# Patient Record
Sex: Male | Born: 1965
Health system: Southern US, Community
[De-identification: ages and names within clinical notes are randomized; demographics above are authoritative.]

## PROBLEM LIST (undated history)

## (undated) DIAGNOSIS — E119 Type 2 diabetes mellitus without complications: Secondary | ICD-10-CM

## (undated) HISTORY — PX: INGUINAL HERNIA REPAIR: SUR1180

## (undated) HISTORY — DX: Type 2 diabetes mellitus without complications: E11.9

---

## 2010-01-27 ENCOUNTER — Emergency Department (HOSPITAL_BASED_OUTPATIENT_CLINIC_OR_DEPARTMENT_OTHER)
Admission: EM | Admit: 2010-01-27 | Discharge: 2010-01-27 | Payer: Self-pay | Source: Home / Self Care | Admitting: Emergency Medicine

## 2010-02-06 ENCOUNTER — Ambulatory Visit: Payer: Self-pay | Admitting: Family Medicine

## 2010-02-06 ENCOUNTER — Ambulatory Visit (HOSPITAL_BASED_OUTPATIENT_CLINIC_OR_DEPARTMENT_OTHER)
Admission: RE | Admit: 2010-02-06 | Discharge: 2010-02-06 | Payer: Self-pay | Source: Home / Self Care | Attending: Family Medicine | Admitting: Family Medicine

## 2010-02-06 DIAGNOSIS — M25539 Pain in unspecified wrist: Secondary | ICD-10-CM

## 2010-02-06 DIAGNOSIS — M25519 Pain in unspecified shoulder: Secondary | ICD-10-CM

## 2010-03-22 NOTE — Assessment & Plan Note (Signed)
Summary: NP,SPRAINED RT WRIST/SHOULDER PAIN,DUE TO FALL 01/27/10,[facility]   Vital Signs:  Patient profile:   45 year old male Height:      70 inches (177.80 cm) Weight:      139.4 pounds (63.36 kg) BMI:     20.07 Temp:     97.8 degrees F (36.56 degrees C) oral Pulse rate:   73 / minute BP sitting:   125 / 80  (left arm)  Vitals Entered By: Baxter Hire) (February 06, 2010 9:32 AM) CC: Sprained right wrist and right shoulder pain Pain Assessment Patient in pain? yes     Location: rt. wrist/shoulder Type: throbbing Onset of pain  pain for the past 10 days/patient fell while roller skating Nutritional Status BMI of 19 -24 = normal  Does patient need assistance? Functional Status Self care Ambulation Normal   CC:  Sprained right wrist and right shoulder pain.  History of Present Illness: 45 yo M here for right wrist and shoulder pain  Patient reports 10 days ago (on 12/10) he was roller skating with his child. He fell down onto an outstretched right hand (wrist extended).  Developed pain mostly in wrist volar area initially and has since developed worse right shoulder pain. Does a lot of work with his hands and using computers. Pain in wrist worse by end of day. No numbness or tingling in right upper extremity. No prior wrist or shoulder injuries. Is right handed. Went to ED after initial injury and had wrist x-rays that were negative.  Advised to come here if not improving.   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 No focal TTP about shoulder. FROM but painful arc. Strength 5/5 with empty can, resisted IR/ER.  Mild pain with resisted IR. + Hawkins, negative Neers Negative speeds and yergasons Negative apprehension NVI distally  R wrist: Small amount of bruising and minimal swelling volar aspect of wrist about distal radius.  TTP about this area.  No focal TTP through hand, in snuffbox, or over ulna.  ~10 degree limitation in extension otherwise FROM. Grip strength 5/5.  Pain with resisted flexion. NVI distally   Impression & Recommendations:  Problem # 1:  WRIST PAIN, RIGHT (WUX-324.40) Assessment New Repeat radiographs negative for fracture.  Given mechanism, location of pain, and exam, feel he has a contusion with likely tendinitis.  Brace, icing, NSAIDs, elevation when possible, relative rest.  Should improve over the next 2-3 weeks. Orders: Diagnostic X-Ray/Fluoroscopy (Diagnostic X-Ray/Flu) Brace Wrist ([id number])  Problem # 2:  SHOULDER PAIN, RIGHT (ICD-719.41) Assessment: New Exam consistent with rotator cuff strain +/- sprain of shoulder capsule.  Strength intact so unlikely to have a significant rotator cuff injury.  Treat conservatively - NSAIDs, icing, limit painful activities.  F/u in 2-3 weeks if not continuing to improve.  Orders: Diagnostic X-Ray/Fluoroscopy (Diagnostic X-Ray/Flu) Brace Wrist ([id number])  Patient Instructions: 1)  Your x-rays of your shoulder and repeat x-rays of your wrist were negative. 2)  Based on your exam you have strained right shoulder and a contusion/tendonitis  of your right wrist. 3)  Both of these will improve with time. 4)  Ice the areas 15 minutes at a time 3-4 times a day. 5)  Try to avoid painful activities - especially lifting with an extended arm and overhead activities. 6)  These will likely improve over the next 2-3 weeks. 7)  Take aleve 2 tabs twice a day with food  regularly for 1 week then as needed after that. 8)  If pain is still not improving after 2-3 weeks, come back in for a reevaluation - we may have to proceed with an MRI of your shoulder if so to ensure there wasn't damage to the muscles or cartilage around the shoulder.   Orders Added: 1)  Diagnostic X-Ray/Fluoroscopy [Diagnostic X-Ray/Flu] 2)  Diagnostic X-Ray/Fluoroscopy [Diagnostic X-Ray/Flu] 3)  New Patient Level III [69629] 4)  Brace Wrist [[id number]]

## 2010-07-24 ENCOUNTER — Encounter: Payer: Self-pay | Admitting: Family Medicine

## 2010-07-24 ENCOUNTER — Ambulatory Visit: Payer: BC Managed Care – PPO | Attending: Family Medicine | Admitting: Physical Therapy

## 2010-07-24 ENCOUNTER — Ambulatory Visit (INDEPENDENT_AMBULATORY_CARE_PROVIDER_SITE_OTHER): Payer: BC Managed Care – PPO | Admitting: Family Medicine

## 2010-07-24 VITALS — BP 125/89 | HR 73 | Temp 97.6°F | Ht 70.0 in | Wt 135.0 lb

## 2010-07-24 DIAGNOSIS — M546 Pain in thoracic spine: Secondary | ICD-10-CM | POA: Insufficient documentation

## 2010-07-24 DIAGNOSIS — M25519 Pain in unspecified shoulder: Secondary | ICD-10-CM

## 2010-07-24 DIAGNOSIS — IMO0001 Reserved for inherently not codable concepts without codable children: Secondary | ICD-10-CM | POA: Insufficient documentation

## 2010-07-24 NOTE — Assessment & Plan Note (Signed)
Patient's pain and exam consistent with scapular weakness/dysfunction and rotator cuff strain.  Excellent strength, negative drop arm test - full thickness tear very unlikely though may have very small partial tear.  Should do well with conservative care - nsaids + PT to start.  If not improving after 6 weeks will consider further imaging +/- subacromial injection depending on his exam.  F/u in 6 weeks.

## 2010-07-24 NOTE — Progress Notes (Signed)
  Subjective:    Patient ID: Matthew Mcintyre, male    DOB: September 02, 1965, 45 y.o.   MRN: [id number]  HPI  45 yo M here for right shoulder pain.  Patient's initial injury was on 01/27/10 when roller skating with his child. Patient reports 10 days ago (on 12/10) he was roller skating with his child. He fell down onto an outstretched right hand (wrist extended).  Developed pain mostly in wrist volar area initially and since developed worse right shoulder pain. Does a lot of work with his hands and using computers. No numbness or tingling in right upper extremity. No prior or shoulder injuries. Is right handed. Shoulder radiographs were negative. Discussed at visit if not improving after 2-3 weeks to return but has just been dealing with pain since then. Pain is top shoulder and in upper back. Worse with overhead motions Stiff in the morning, better during the day. Taking ibuprofen and using icy hot Does not wake him up at night.  Worse lying on right side though.  History reviewed. No pertinent past medical history.  No current outpatient prescriptions on file prior to visit.    History reviewed. No pertinent past surgical history.  No Known Allergies  History   Social History  . Marital Status: Divorced    Spouse Name: N/A    Number of Children: N/A  . Years of Education: N/A   Occupational History  . Not on file.   Social History Main Topics  . Smoking status: Never Smoker   . Smokeless tobacco: Not on file  . Alcohol Use: Not on file  . Drug Use: Not on file  . Sexually Active: Not on file   Other Topics Concern  . Not on file   Social History Narrative  . No narrative on file    Family History  Problem Relation Age of Onset  . Hypertension Mother   . Heart attack Neg Hx   . Diabetes Neg Hx     BP 125/89  Pulse 73  Temp(Src) 97.6 F (36.4 C) (Oral)  Ht 5\' 10"  (1.778 m)  Wt 135 lb (61.236 kg)  BMI 19.37 kg/m2  Review of Systems See HPI above.      Objective:   Physical Exam  General:  Well-developed,well-nourished,in no acute distress; alert,appropriate and cooperative throughout examination  R shoulder: No gross deformity, swelling, or bruising. No focal TTP about shoulder including biceps tendon or AC joint.  Mild TTP superficial to right scapula and medial to scapula in back. FROM but painful arc - pain upper back and lateral right shoulder. Strength 5/5 with empty can, resisted IR/ER.  Mild pain with resisted ER and empty can.  Negative drop arm. + Hawkins, negative Neers Negative speeds and yergasons Negative apprehension NVI distally No scapular winging.     Assessment & Plan:  1. Right shoulder pain - Patient's pain and exam consistent with scapular weakness/dysfunction and rotator cuff strain.  Excellent strength, negative drop arm test - full thickness tear very unlikely though may have very small partial tear.  Should do well with conservative care - nsaids + PT to start.  If not improving after 6 weeks will consider further imaging +/- subacromial injection depending on his exam.  F/u in 6 weeks.

## 2010-07-24 NOTE — Patient Instructions (Signed)
Your exam is consistent with scapular dysfunction (spasm and pain in right upper back muscles that control shoulder blade movement) and rotator cuff tendinopathy. Your strength is great and exam does not indicate a rotator cuff tear that would need surgery. Start physical therapy regularly for the next 6 weeks. Take aleve 2 tabs twice a day with food for pain x 7 days then as needed. Ice shoulder 15 minutes at a time 3-4 times a day. Try to avoid overhead activities, reaching activities as much as possible. Follow up with me in 6 weeks to recheck how you're doing (4 weeks if really struggling). The next step would be an MRI of your shoulder if you're not improving.

## 2010-08-07 ENCOUNTER — Ambulatory Visit: Payer: BC Managed Care – PPO | Admitting: Physical Therapy

## 2010-08-09 ENCOUNTER — Ambulatory Visit: Payer: BC Managed Care – PPO | Admitting: Physical Therapy

## 2010-08-15 ENCOUNTER — Ambulatory Visit: Payer: BC Managed Care – PPO | Admitting: Rehabilitation

## 2010-08-21 ENCOUNTER — Encounter: Payer: BC Managed Care – PPO | Admitting: Rehabilitation

## 2010-08-28 ENCOUNTER — Ambulatory Visit: Payer: BC Managed Care – PPO | Admitting: Rehabilitation

## 2010-09-04 ENCOUNTER — Ambulatory Visit: Payer: BC Managed Care – PPO | Admitting: Family Medicine

## 2011-09-06 IMAGING — CR DG SHOULDER 2+V*R*
4 series · 4 of 4 positions shown · non-contrast
Comparison: None.

CLINICAL DATA: 44-year-old male status post fall 10 days ago with
pain.

RIGHT SHOULDER - 2+ VIEW

[w shoulder ap internal righ]
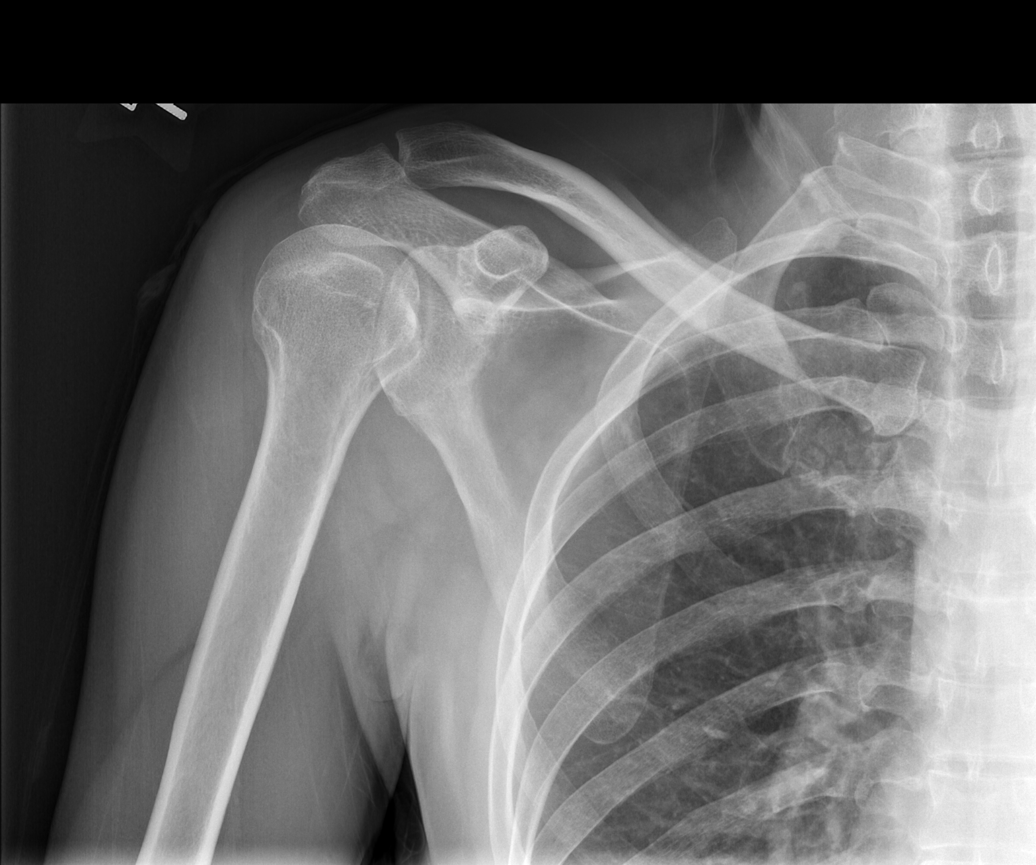

[w shoulder ap external righ]
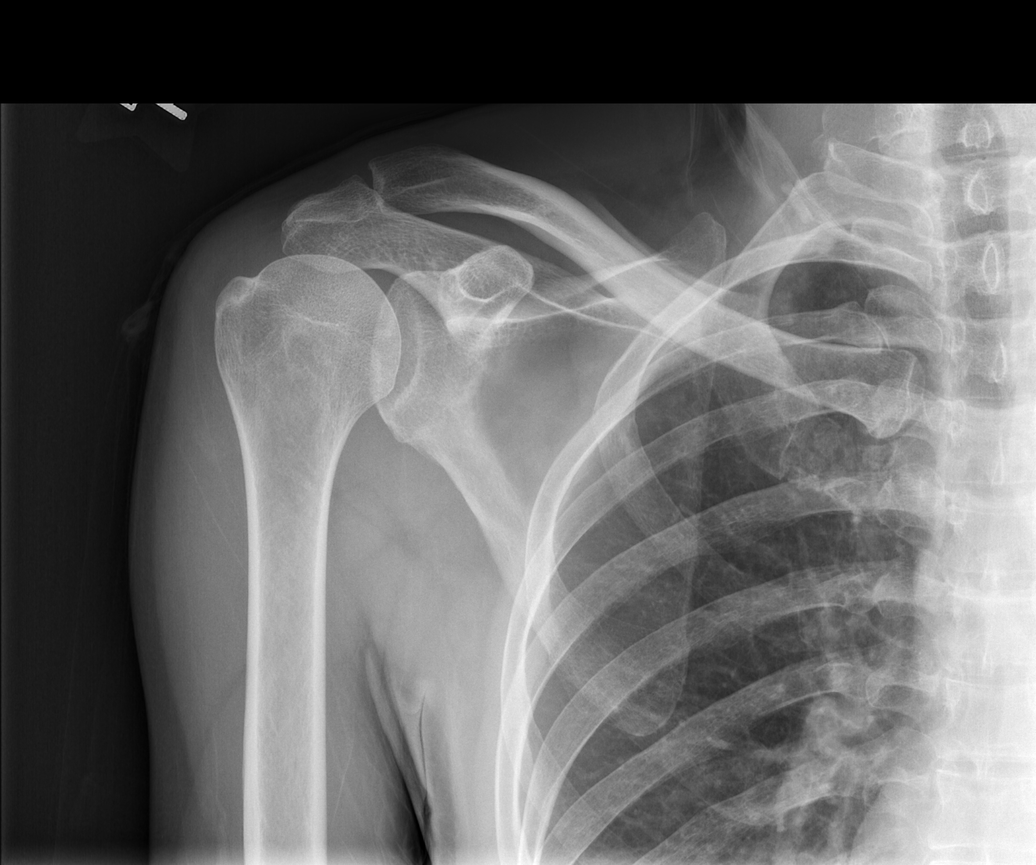

[w shoulder y view right]
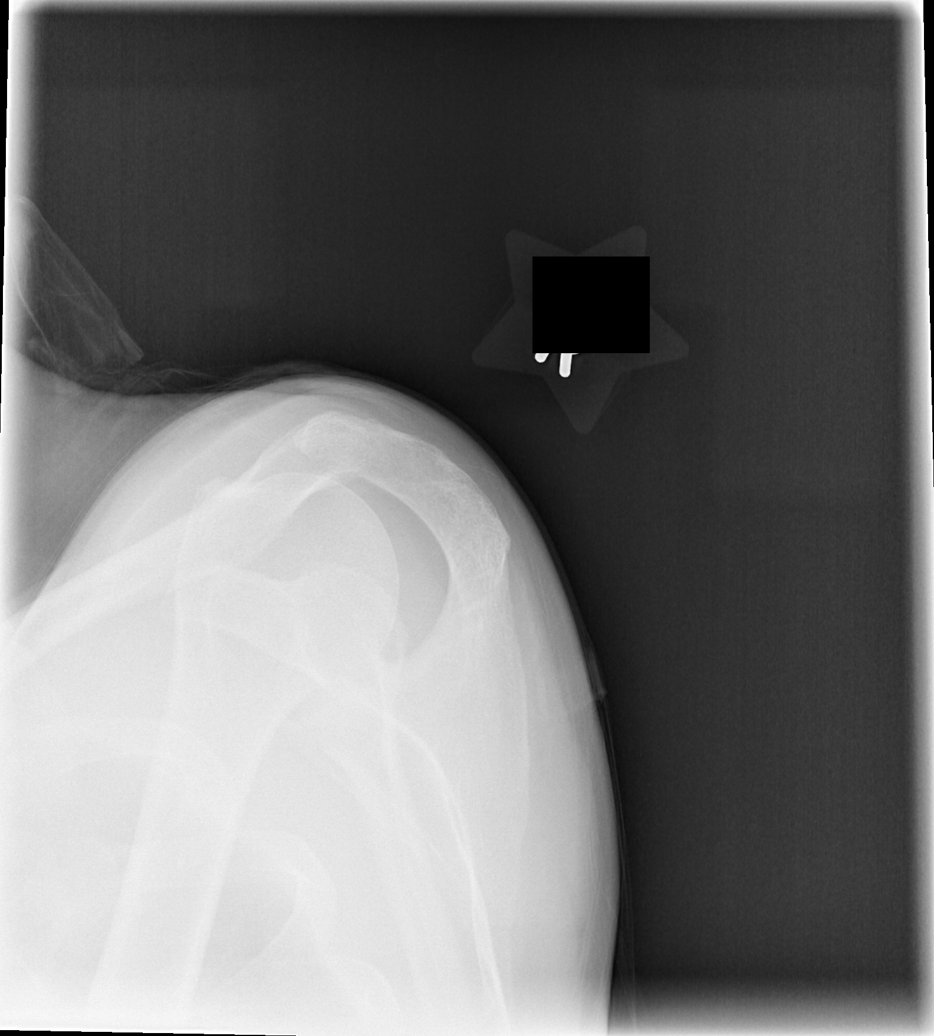

[x shoulder axillary right]
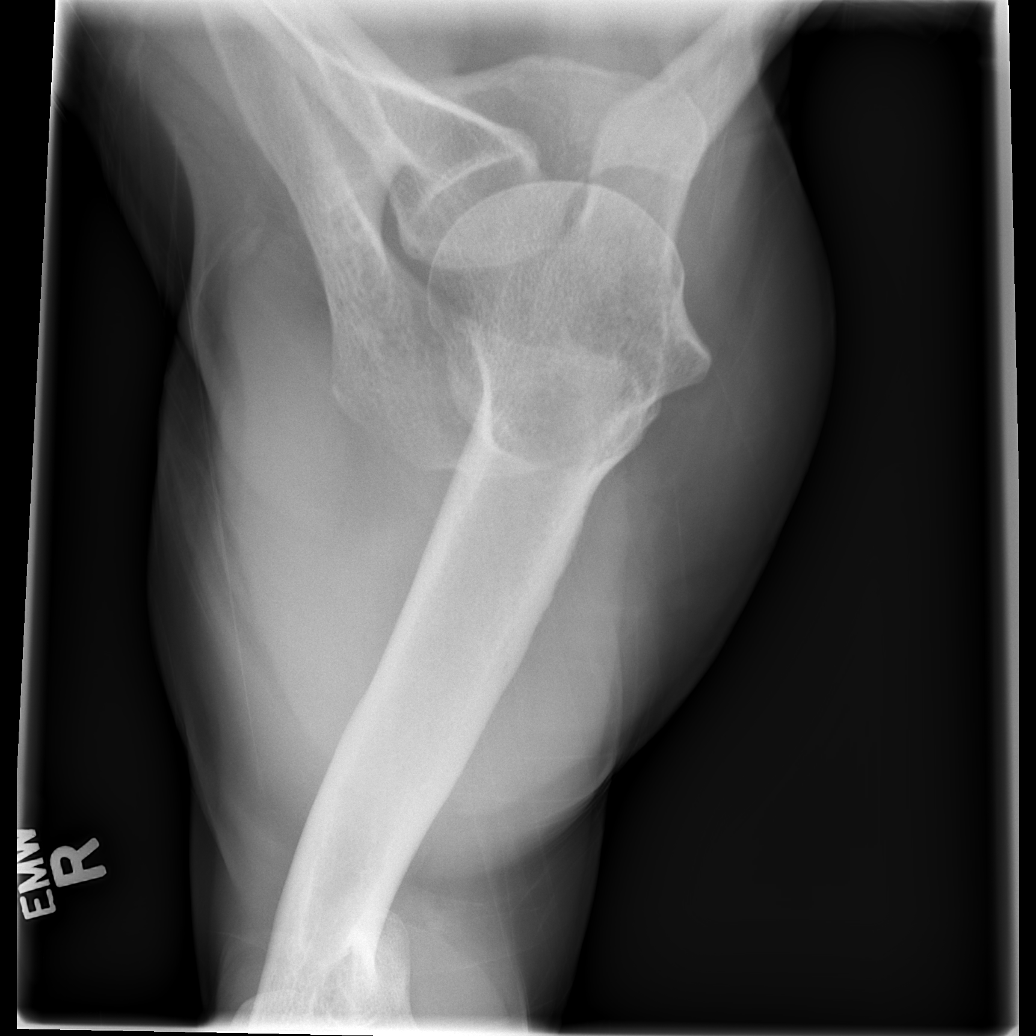

[4 of 4 positions shown; findings below may reference images not displayed]

FINDINGS: No glenohumeral joint dislocation.  Right clavicle,
proximal right humerus, and right scapula appear intact.
Visualized ribs and lung parenchyma within normal limits.
IMPRESSION: No acute fracture or dislocation identified about the right
shoulder.

## 2017-09-16 ENCOUNTER — Other Ambulatory Visit: Payer: Self-pay

## 2017-09-16 ENCOUNTER — Encounter (HOSPITAL_BASED_OUTPATIENT_CLINIC_OR_DEPARTMENT_OTHER): Payer: Self-pay | Admitting: *Deleted

## 2017-09-16 ENCOUNTER — Emergency Department (HOSPITAL_BASED_OUTPATIENT_CLINIC_OR_DEPARTMENT_OTHER): Payer: No Typology Code available for payment source

## 2017-09-16 ENCOUNTER — Emergency Department (HOSPITAL_BASED_OUTPATIENT_CLINIC_OR_DEPARTMENT_OTHER)
Admission: EM | Admit: 2017-09-16 | Discharge: 2017-09-16 | Disposition: A | Discharge status: Home / Self Care | Payer: No Typology Code available for payment source | Attending: Emergency Medicine | Admitting: Emergency Medicine

## 2017-09-16 DIAGNOSIS — R51 Headache: Secondary | ICD-10-CM | POA: Insufficient documentation

## 2017-09-16 DIAGNOSIS — R42 Dizziness and giddiness: Secondary | ICD-10-CM

## 2017-09-16 DIAGNOSIS — R519 Headache, unspecified: Secondary | ICD-10-CM

## 2017-09-16 LAB — CBC WITH DIFFERENTIAL/PLATELET
Basophils Absolute: 0 10*3/uL (ref 0.0–0.1)
Basophils Relative: 0 %
EOS PCT: 1 %
Eosinophils Absolute: 0 10*3/uL (ref 0.0–0.7)
HCT: 42.1 % (ref 39.0–52.0)
Hemoglobin: 14.7 g/dL (ref 13.0–17.0)
LYMPHS ABS: 1.9 10*3/uL (ref 0.7–4.0)
LYMPHS PCT: 26 %
MCH: 30.5 pg (ref 26.0–34.0)
MCHC: 34.9 g/dL (ref 30.0–36.0)
MCV: 87.3 fL (ref 78.0–100.0)
MONO ABS: 0.4 10*3/uL (ref 0.1–1.0)
MONOS PCT: 6 %
NEUTROS ABS: 4.9 10*3/uL (ref 1.7–7.7)
Neutrophils Relative %: 67 %
PLATELETS: 294 10*3/uL (ref 150–400)
RBC: 4.82 MIL/uL (ref 4.22–5.81)
RDW: 12.3 % (ref 11.5–15.5)
WBC: 7.2 10*3/uL (ref 4.0–10.5)

## 2017-09-16 LAB — URINALYSIS, ROUTINE W REFLEX MICROSCOPIC
Bilirubin Urine: NEGATIVE
Glucose, UA: NEGATIVE mg/dL
KETONES UR: NEGATIVE mg/dL
LEUKOCYTES UA: NEGATIVE
NITRITE: NEGATIVE
PH: 7 (ref 5.0–8.0)
Protein, ur: NEGATIVE mg/dL

## 2017-09-16 LAB — COMPREHENSIVE METABOLIC PANEL
ALT: 38 U/L (ref 0–44)
AST: 26 U/L (ref 15–41)
Albumin: 4.4 g/dL (ref 3.5–5.0)
Alkaline Phosphatase: 30 U/L — ABNORMAL LOW (ref 38–126)
Anion gap: 6 (ref 5–15)
BUN: 21 mg/dL — ABNORMAL HIGH (ref 6–20)
CO2: 27 mmol/L (ref 22–32)
Calcium: 8.8 mg/dL — ABNORMAL LOW (ref 8.9–10.3)
Chloride: 107 mmol/L (ref 98–111)
Creatinine, Ser: 1 mg/dL (ref 0.61–1.24)
GFR calc Af Amer: >60 mL/min (ref >60)
GLUCOSE: 101 mg/dL — AB (ref 70–99)
POTASSIUM: 4 mmol/L (ref 3.5–5.1)
Sodium: 140 mmol/L (ref 135–145)
TOTAL PROTEIN: 6.9 g/dL (ref 6.5–8.1)
Total Bilirubin: 0.8 mg/dL (ref 0.3–1.2)

## 2017-09-16 LAB — URINALYSIS, MICROSCOPIC (REFLEX): WBC UA: NONE SEEN WBC/hpf (ref 0–5)

## 2017-09-16 LAB — TROPONIN I: Troponin I: <0.03 ng/mL (ref <0.03)

## 2017-09-16 MED ORDER — LORAZEPAM 1 MG PO TABS
0.5000 mg | ORAL_TABLET | Freq: Once | ORAL | Status: AC
  Administered 2017-09-16: 0.5 mg via ORAL
  Filled 2017-09-16: qty 1

## 2017-09-16 MED ORDER — IOPAMIDOL (ISOVUE-370) INJECTION 76%
100.0000 mL | Freq: Once | INTRAVENOUS | Status: AC | PRN
  Administered 2017-09-16: 100 mL via INTRAVENOUS

## 2017-09-16 MED ORDER — LACTATED RINGERS IV BOLUS
2000.0000 mL | Freq: Once | INTRAVENOUS | Status: AC
  Administered 2017-09-16: 2000 mL via INTRAVENOUS

## 2017-09-16 NOTE — ED Provider Notes (Signed)
Emergency Department Provider Note   I have reviewed the triage vital signs and the nursing notes.   HISTORY  Chief Complaint No chief complaint on file.   HPI Matthew Mcintyre is a 52 y.o. male without significant past medical history the presents the emergency department today with multiple complaints.  It seems like her last 3 to 4 weeks she has had intermittent and progressively worsening headaches, weakness, neck pain, fatigue, lightheadedness and intermittent right sided "face pulling back".  During his time is not had any respiratory illnesses, vomiting or diarrhea but has had some nausea.  Has not noticed any rashes.  Has not noticed any weight loss or weight gain.  He states he did have couple episodes of a dull achy feeling in his chest but is unsure if it is related to the symptoms.  No insect bites.  No vision changes.  States he feels a swelling in the back of his neck.  He also states that he has been more anxious and having more stress at work than normal.  He is unsure if that is related or not. No other associated or modifying symptoms.    History reviewed. No pertinent past medical history.  Patient Active Problem List   Diagnosis Date Noted  . SHOULDER PAIN, RIGHT 02/06/2010    History reviewed. No pertinent surgical history.    Allergies Patient has no known allergies.  Family History  Problem Relation Age of Onset  . Hypertension Mother   . Heart attack Neg Hx   . Diabetes Neg Hx     Social History Social History   Tobacco Use  . Smoking status: Never Smoker  . Smokeless tobacco: Never Used  Substance Use Topics  . Alcohol use: Not on file  . Drug use: Not on file    Review of Systems  All other systems negative except as documented in the HPI. All pertinent positives and negatives as reviewed in the HPI. ____________________________________________   PHYSICAL EXAM:  VITAL SIGNS: ED Triage Vitals  Enc Vitals Group     BP 09/16/17  1353 (!) 133/91     Pulse Rate 09/16/17 1353 88     Resp 09/16/17 1353 16     Temp 09/16/17 1353 98.1 F (36.7 C)     Temp Source 09/16/17 1353 Oral     SpO2 09/16/17 1353 99 %     Weight 09/16/17 1348 155 lb (70.3 kg)     Height 09/16/17 1348 5\' 10"  (1.778 m)     Head Circumference --      Peak Flow --      Pain Score 09/16/17 1348 5     Pain Loc --      Pain Edu? --      Excl. in GC? --     Constitutional: Alert and oriented. Well appearing and in no acute distress. Eyes: Conjunctivae are normal. PERRL. EOMI. Head: Atraumatic. Nose: No congestion/rhinnorhea. Mouth/Throat: Mucous membranes are moist.  Oropharynx non-erythematous. Neck: No stridor.  No meningeal signs.   Cardiovascular: Normal rate, regular rhythm. Good peripheral circulation. Grossly normal heart sounds.   Respiratory: Normal respiratory effort.  No retractions. Lungs CTAB. Gastrointestinal: Soft and nontender. No distention.  Musculoskeletal: No lower extremity tenderness nor edema. No gross deformities of extremities. Neurologic:  Normal speech and language. No gross focal neurologic deficits are appreciated.  Skin:  Skin is warm, dry and intact. No rash noted.   ____________________________________________   LABS (all labs ordered are listed, but  only abnormal results are displayed)  Labs Reviewed  COMPREHENSIVE METABOLIC PANEL - Abnormal; Notable for the following components:      Result Value   Glucose, Bld 101 (*)    BUN 21 (*)    Calcium 8.8 (*)    Alkaline Phosphatase 30 (*)    All other components within normal limits  URINALYSIS, ROUTINE W REFLEX MICROSCOPIC - Abnormal; Notable for the following components:   Specific Gravity, Urine <1.005 (*)    Hgb urine dipstick TRACE (*)    All other components within normal limits  URINALYSIS, MICROSCOPIC (REFLEX) - Abnormal; Notable for the following components:   Bacteria, UA RARE (*)    All other components within normal limits  CBC WITH  DIFFERENTIAL/PLATELET  TSH  TROPONIN I   ____________________________________________  EKG   EKG Interpretation  Date/Time:  Tuesday September 16 2017 14:43:07 EDT Ventricular Rate:  84 PR Interval:    QRS Duration: 89 QT Interval:  363 QTC Calculation: 430 R Axis:   68 Text Interpretation:  Sinus rhythm Atrial premature complexes No STEMI. No prior for comparison.  Confirmed by Alona BeneLong, Joshua [id number]) on 09/16/2017 2:57:32 PM       ____________________________________________  RADIOLOGY  No results found.  ____________________________________________   PROCEDURES  Procedure(s) performed:   Procedures   ____________________________________________   INITIAL IMPRESSION / ASSESSMENT AND PLAN / ED COURSE  Headaches that are sometimes worse in the morning and progressively worsening with other nonspecific complaints could be related to sense of a intracranial tumor versus metabolic issues.  His heart rate goes from mid 80s to 110 just with movement.  Possible anxiety component but also likely dehydration so we will get some fluids for that as well.  Other basic labs and EKG to make sure no evidence of cardiac involvement even though he has no risk factors.  Workup unremarkable, unclear etiology for symptoms. Improved symptoms in ER. Plan for dischagre with pcp follow up in one week as previously planned.   Pertinent labs & imaging results that were available during my care of the patient were reviewed by me and considered in my medical decision making (see chart for details).  ____________________________________________  FINAL CLINICAL IMPRESSION(S) / ED DIAGNOSES  Final diagnoses:  Light headed  Acute nonintractable headache, unspecified headache type     MEDICATIONS GIVEN DURING THIS VISIT:  Medications  lactated ringers bolus 2,000 mL ( Intravenous Stopped 09/16/17 1701)  iopamidol (ISOVUE-370) 76 % injection 100 mL (100 mLs Intravenous Contrast Given 09/16/17  1643)  LORazepam (ATIVAN) tablet 0.5 mg (0.5 mg Oral Given 09/16/17 1813)     NEW OUTPATIENT MEDICATIONS STARTED DURING THIS VISIT:  There are no discharge medications for this patient.   Note:  This note was prepared with assistance of Dragon voice recognition software. Occasional wrong-word or sound-a-like substitutions may have occurred due to the inherent limitations of voice recognition software.   Marily MemosMesner, Marin Milley, MD 09/17/17 1730

## 2017-09-16 NOTE — ED Triage Notes (Signed)
4 days of lightheadedness, decreased appetite no energy and neck pain. States his BP has been elevated at home. No hx of HTN. He has an appointment with his MD next week.

## 2017-09-16 NOTE — ED Notes (Signed)
Pt ambulated around department to bathroom with no difficulty. Reports HA but denies blurred vision, dizziness, lightheadedness. Pt had steady gate

## 2017-09-16 NOTE — ED Notes (Signed)
Pt returned from CT °

## 2017-09-17 LAB — TSH: TSH: 1.144 u[IU]/mL (ref 0.350–4.500)

## 2019-08-26 ENCOUNTER — Other Ambulatory Visit: Payer: Self-pay

## 2019-08-26 ENCOUNTER — Encounter: Payer: Commercial Managed Care - PPO | Attending: Family Medicine | Admitting: Registered"

## 2019-08-26 ENCOUNTER — Encounter: Payer: Self-pay | Admitting: Registered"

## 2019-08-26 DIAGNOSIS — E119 Type 2 diabetes mellitus without complications: Secondary | ICD-10-CM | POA: Diagnosis not present

## 2019-08-26 NOTE — Patient Instructions (Addendum)
Goals:  Follow Diabetes Meal Plan as instructed  Eat 3 meals and 2 snacks, every 3-5 hrs  Aim to have 1/2 plate of non-starchy vegetables + 1/4 plate starch/grain + 14 plate protein  Limit carbohydrate intake to 45-60 grams carbohydrate/meal  Limit carbohydrate intake to 15-30 grams carbohydrate/snack  Snacks include source of carbohydrate + protein  Add lean protein foods to meals/snacks  Aim for 30 mins of physical activity daily. Can get started with 15-20 min, 2 days/week.

## 2019-08-26 NOTE — Progress Notes (Addendum)
Diabetes Self-Management Education  Visit Type:  First/Initial  Appt. Start Time: 2:05  Appt. End Time: 3:21  08/26/2019  [name], identified by name and date of birth, is a 54 y.o. male with a diagnosis of Diabetes: Type 2.   ASSESSMENT  Pt states he is at a loss of what to eat. States he has not been drinking sodas, sweet tea, coffee; replaced with water and sometimes unsweetened tea. Reports he does not eat many sweets and has been eating burgers without the bun. Reports his weaknesses are potatoes, rice, and pasta. Reports he likes salads but does not want to eat them all the time. Reports struggling with snacking on the weekends. States he does not snack during workday due to being busy. States he eats out often. States prior to diagnosis eating was: B: granola bar L: fast food  D: fast food  Recent labs reveal elevated A1c (8.0), elevated Chol (236), and elevated Trig (384).   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 Typically works Mon-Fri, 10 hours/day.   States he knows he needs to be more active and make time for it. States it works better for him to make an appointment with himself to walk.   Has 2 children (ages 36 and 71). Oldest daughter lives at home with him and will be going to college in August. States he sees PCP on end of August to have A1c rechecked.   There were no vitals taken for this visit. There is no height or weight on file to calculate BMI.    Diabetes Self-Management Education - 08/26/19 1415      Health Coping   How would you rate your overall health? Good      Psychosocial Assessment   Patient Belief/Attitude about Diabetes Motivated to manage diabetes    Self-care barriers None    Self-management support Family;Friends;Doctor's office    Patient Concerns Nutrition/Meal planning    Special Needs None    Preferred Learning Style No preference indicated    Learning Readiness Change in progress      Complications    Last HgB A1C per patient/outside source 8 %    How often do you check your blood sugar? 0 times/day (not testing)    Number of hypoglycemic episodes per month 1    Can you tell when your blood sugar is low? No    Number of hyperglycemic episodes per week 0    Have you had a dilated eye exam in the past 12 months? Yes    Have you had a dental exam in the past 12 months? No    Are you checking your feet? No      Dietary Intake   Breakfast 8 am - 5 powdered donuts or sometimes skips    Lunch 12-1 pm - Mexican - rice + chicken + cheese + tortillas or [employer] - grilled chicken salad (with vinaigrette dressing) + water    Dinner 9 pm - [employer] - 12 grilled nuggets (with + lg fries + water    Beverage(s) water, unsweetened tea      Exercise   Exercise Type ADL's    How many days per week to you exercise? 0    How many minutes per day do you exercise? 0    Total minutes per week of exercise 0      Patient Education   Previous Diabetes Education No    Disease state  Definition of diabetes, type  1 and 2, and the diagnosis of diabetes;Factors that contribute to the development of diabetes    Nutrition management  Role of diet in the treatment of diabetes and the relationship between the three main macronutrients and blood glucose level;Information on hints to eating out and maintain blood glucose control.;Food label reading, portion sizes and measuring food.;Reviewed blood glucose goals for pre and post meals and how to evaluate the patients' food intake on their blood glucose level.    Physical activity and exercise  Role of exercise on diabetes management, blood pressure control and cardiac health.    Monitoring Purpose and frequency of SMBG.;Taught/discussed recording of test results and interpretation of SMBG.;Interpreting lab values - A1C, lipid, urine microalbumina.;Identified appropriate SMBG and/or A1C goals.;Yearly dilated eye exam    Acute complications Taught treatment of hypoglycemia -  the 15 rule.;Discussed and identified patients' treatment of hyperglycemia.    Chronic complications Relationship between chronic complications and blood glucose control;Lipid levels, blood glucose control and heart disease;Identified and discussed with patient  current chronic complications;Retinopathy and reason for yearly dilated eye exams;Applicable immunizations;Reviewed with patient heart disease, higher risk of, and prevention    Psychosocial adjustment Worked with patient to identify barriers to care and solutions;Identified and addressed patients feelings and concerns about diabetes      Individualized Goals (developed by patient)   Nutrition General guidelines for healthy choices and portions discussed    Physical Activity Exercise 1-2 times per week;15 minutes per day    Medications take my medication as prescribed    Monitoring  Not Applicable    Reducing Risk increase portions of nuts and seeds;treat hypoglycemia with 15 grams of carbs if blood glucose less than 70mg /dL      Post-Education Assessment   Patient understands the diabetes disease and treatment process. Demonstrates understanding / competency    Patient understands incorporating nutritional management into lifestyle. Demonstrates understanding / competency    Patient undertands incorporating physical activity into lifestyle. Demonstrates understanding / competency    Patient understands using medications safely. Demonstrates understanding / competency    Patient understands monitoring blood glucose, interpreting and using results Demonstrates understanding / competency    Patient understands prevention, detection, and treatment of acute complications. Demonstrates understanding / competency    Patient understands prevention, detection, and treatment of chronic complications. Demonstrates understanding / competency    Patient understands how to develop strategies to address psychosocial issues. Demonstrates understanding /  competency    Patient understands how to develop strategies to promote health/change behavior. Demonstrates understanding / competency      Outcomes   Program Status Completed           Learning Objective:  Patient will have a greater understanding of diabetes self-management. Patient education plan is to attend individual and/or group sessions per assessed needs and concerns.   Plan:   Patient Instructions  Goals:  Follow Diabetes Meal Plan as instructed  Eat 3 meals and 2 snacks, every 3-5 hrs  Aim to have 1/2 plate of non-starchy vegetables + 1/4 plate starch/grain + 14 plate protein  Limit carbohydrate intake to 45-60 grams carbohydrate/meal  Limit carbohydrate intake to 15-30 grams carbohydrate/snack  Snacks include source of carbohydrate + protein  Add lean protein foods to meals/snacks  Aim for 30 mins of physical activity daily. Can get started with 15-20 min, 2 days/week.      Expected Outcomes:  Demonstrated interest in learning. Expect positive outcomes  Education material provided: ADA - How to Thrive: A  Guide for Your Journey with Diabetes  If problems or questions, patient to contact team via:  Phone and Email  Future DSME appointment: - PRN

## 2020-05-19 DIAGNOSIS — M47812 Spondylosis without myelopathy or radiculopathy, cervical region: Secondary | ICD-10-CM | POA: Diagnosis not present

## 2020-05-19 DIAGNOSIS — M62830 Muscle spasm of back: Secondary | ICD-10-CM | POA: Diagnosis not present

## 2020-05-19 DIAGNOSIS — M9902 Segmental and somatic dysfunction of thoracic region: Secondary | ICD-10-CM | POA: Diagnosis not present

## 2020-05-19 DIAGNOSIS — M9901 Segmental and somatic dysfunction of cervical region: Secondary | ICD-10-CM | POA: Diagnosis not present

## 2020-06-30 DIAGNOSIS — M47812 Spondylosis without myelopathy or radiculopathy, cervical region: Secondary | ICD-10-CM | POA: Diagnosis not present

## 2020-06-30 DIAGNOSIS — M62830 Muscle spasm of back: Secondary | ICD-10-CM | POA: Diagnosis not present

## 2020-06-30 DIAGNOSIS — M9901 Segmental and somatic dysfunction of cervical region: Secondary | ICD-10-CM | POA: Diagnosis not present

## 2020-06-30 DIAGNOSIS — M9902 Segmental and somatic dysfunction of thoracic region: Secondary | ICD-10-CM | POA: Diagnosis not present

## 2020-07-21 DIAGNOSIS — Z125 Encounter for screening for malignant neoplasm of prostate: Secondary | ICD-10-CM | POA: Diagnosis not present

## 2020-07-21 DIAGNOSIS — E119 Type 2 diabetes mellitus without complications: Secondary | ICD-10-CM | POA: Diagnosis not present

## 2020-07-21 DIAGNOSIS — Z23 Encounter for immunization: Secondary | ICD-10-CM | POA: Diagnosis not present

## 2020-07-21 DIAGNOSIS — F3341 Major depressive disorder, recurrent, in partial remission: Secondary | ICD-10-CM | POA: Diagnosis not present

## 2020-07-21 DIAGNOSIS — F411 Generalized anxiety disorder: Secondary | ICD-10-CM | POA: Diagnosis not present

## 2020-07-21 DIAGNOSIS — Z0000 Encounter for general adult medical examination without abnormal findings: Secondary | ICD-10-CM | POA: Diagnosis not present

## 2020-07-21 DIAGNOSIS — E782 Mixed hyperlipidemia: Secondary | ICD-10-CM | POA: Diagnosis not present

## 2020-07-21 DIAGNOSIS — E1169 Type 2 diabetes mellitus with other specified complication: Secondary | ICD-10-CM | POA: Diagnosis not present

## 2020-07-24 DIAGNOSIS — Z125 Encounter for screening for malignant neoplasm of prostate: Secondary | ICD-10-CM | POA: Diagnosis not present

## 2020-07-24 DIAGNOSIS — E1169 Type 2 diabetes mellitus with other specified complication: Secondary | ICD-10-CM | POA: Diagnosis not present

## 2020-07-24 DIAGNOSIS — E782 Mixed hyperlipidemia: Secondary | ICD-10-CM | POA: Diagnosis not present

## 2020-08-18 DIAGNOSIS — M62830 Muscle spasm of back: Secondary | ICD-10-CM | POA: Diagnosis not present

## 2020-08-18 DIAGNOSIS — M47812 Spondylosis without myelopathy or radiculopathy, cervical region: Secondary | ICD-10-CM | POA: Diagnosis not present

## 2020-08-18 DIAGNOSIS — M9901 Segmental and somatic dysfunction of cervical region: Secondary | ICD-10-CM | POA: Diagnosis not present

## 2020-08-18 DIAGNOSIS — M9902 Segmental and somatic dysfunction of thoracic region: Secondary | ICD-10-CM | POA: Diagnosis not present

## 2020-09-29 DIAGNOSIS — M62830 Muscle spasm of back: Secondary | ICD-10-CM | POA: Diagnosis not present

## 2020-09-29 DIAGNOSIS — M47812 Spondylosis without myelopathy or radiculopathy, cervical region: Secondary | ICD-10-CM | POA: Diagnosis not present

## 2020-09-29 DIAGNOSIS — M9902 Segmental and somatic dysfunction of thoracic region: Secondary | ICD-10-CM | POA: Diagnosis not present

## 2020-09-29 DIAGNOSIS — M9901 Segmental and somatic dysfunction of cervical region: Secondary | ICD-10-CM | POA: Diagnosis not present

## 2020-11-10 DIAGNOSIS — M9902 Segmental and somatic dysfunction of thoracic region: Secondary | ICD-10-CM | POA: Diagnosis not present

## 2020-11-10 DIAGNOSIS — M9901 Segmental and somatic dysfunction of cervical region: Secondary | ICD-10-CM | POA: Diagnosis not present

## 2020-11-10 DIAGNOSIS — M62830 Muscle spasm of back: Secondary | ICD-10-CM | POA: Diagnosis not present

## 2020-11-10 DIAGNOSIS — M47812 Spondylosis without myelopathy or radiculopathy, cervical region: Secondary | ICD-10-CM | POA: Diagnosis not present

## 2020-12-22 DIAGNOSIS — M9901 Segmental and somatic dysfunction of cervical region: Secondary | ICD-10-CM | POA: Diagnosis not present

## 2020-12-22 DIAGNOSIS — M9902 Segmental and somatic dysfunction of thoracic region: Secondary | ICD-10-CM | POA: Diagnosis not present

## 2020-12-22 DIAGNOSIS — M62830 Muscle spasm of back: Secondary | ICD-10-CM | POA: Diagnosis not present

## 2020-12-22 DIAGNOSIS — M47812 Spondylosis without myelopathy or radiculopathy, cervical region: Secondary | ICD-10-CM | POA: Diagnosis not present

## 2021-01-26 DIAGNOSIS — F331 Major depressive disorder, recurrent, moderate: Secondary | ICD-10-CM | POA: Diagnosis not present

## 2021-01-26 DIAGNOSIS — F411 Generalized anxiety disorder: Secondary | ICD-10-CM | POA: Diagnosis not present

## 2021-01-26 DIAGNOSIS — E782 Mixed hyperlipidemia: Secondary | ICD-10-CM | POA: Diagnosis not present

## 2021-01-26 DIAGNOSIS — E1169 Type 2 diabetes mellitus with other specified complication: Secondary | ICD-10-CM | POA: Diagnosis not present

## 2021-02-23 DIAGNOSIS — M62830 Muscle spasm of back: Secondary | ICD-10-CM | POA: Diagnosis not present

## 2021-02-23 DIAGNOSIS — M9901 Segmental and somatic dysfunction of cervical region: Secondary | ICD-10-CM | POA: Diagnosis not present

## 2021-02-23 DIAGNOSIS — M9902 Segmental and somatic dysfunction of thoracic region: Secondary | ICD-10-CM | POA: Diagnosis not present

## 2021-02-23 DIAGNOSIS — M47812 Spondylosis without myelopathy or radiculopathy, cervical region: Secondary | ICD-10-CM | POA: Diagnosis not present

## 2021-03-21 ENCOUNTER — Emergency Department (HOSPITAL_BASED_OUTPATIENT_CLINIC_OR_DEPARTMENT_OTHER): Payer: BC Managed Care – PPO

## 2021-03-21 ENCOUNTER — Emergency Department (HOSPITAL_BASED_OUTPATIENT_CLINIC_OR_DEPARTMENT_OTHER)
Admission: EM | Admit: 2021-03-21 | Discharge: 2021-03-21 | Disposition: A | Discharge status: Home / Self Care | Payer: BC Managed Care – PPO | Attending: Emergency Medicine | Admitting: Emergency Medicine

## 2021-03-21 ENCOUNTER — Other Ambulatory Visit: Payer: Self-pay

## 2021-03-21 ENCOUNTER — Encounter (HOSPITAL_BASED_OUTPATIENT_CLINIC_OR_DEPARTMENT_OTHER): Payer: Self-pay

## 2021-03-21 DIAGNOSIS — R109 Unspecified abdominal pain: Secondary | ICD-10-CM | POA: Diagnosis not present

## 2021-03-21 DIAGNOSIS — N200 Calculus of kidney: Secondary | ICD-10-CM | POA: Diagnosis not present

## 2021-03-21 DIAGNOSIS — N201 Calculus of ureter: Secondary | ICD-10-CM | POA: Diagnosis not present

## 2021-03-21 DIAGNOSIS — Z7984 Long term (current) use of oral hypoglycemic drugs: Secondary | ICD-10-CM | POA: Insufficient documentation

## 2021-03-21 DIAGNOSIS — E119 Type 2 diabetes mellitus without complications: Secondary | ICD-10-CM | POA: Diagnosis not present

## 2021-03-21 DIAGNOSIS — R1031 Right lower quadrant pain: Secondary | ICD-10-CM | POA: Diagnosis not present

## 2021-03-21 LAB — URINALYSIS, ROUTINE W REFLEX MICROSCOPIC
Bilirubin Urine: NEGATIVE
Glucose, UA: NEGATIVE mg/dL
Ketones, ur: NEGATIVE mg/dL
Leukocytes,Ua: NEGATIVE
Nitrite: NEGATIVE
RBC / HPF: >50 RBC/hpf — ABNORMAL HIGH (ref 0–5)
Specific Gravity, Urine: 1.024 (ref 1.005–1.030)
pH: 6 (ref 5.0–8.0)

## 2021-03-21 LAB — CBC WITH DIFFERENTIAL/PLATELET
Abs Immature Granulocytes: 0.03 10*3/uL (ref 0.00–0.07)
Basophils Absolute: 0.1 10*3/uL (ref 0.0–0.1)
Basophils Relative: 1 %
Eosinophils Absolute: 0.2 10*3/uL (ref 0.0–0.5)
Eosinophils Relative: 2 %
HCT: 42.7 % (ref 39.0–52.0)
Hemoglobin: 14.7 g/dL (ref 13.0–17.0)
Immature Granulocytes: 0 %
Lymphocytes Relative: 32 %
Lymphs Abs: 2.9 10*3/uL (ref 0.7–4.0)
MCH: 28.7 pg (ref 26.0–34.0)
MCHC: 34.4 g/dL (ref 30.0–36.0)
MCV: 83.2 fL (ref 80.0–100.0)
Monocytes Absolute: 0.4 10*3/uL (ref 0.1–1.0)
Monocytes Relative: 5 %
Neutro Abs: 5.6 10*3/uL (ref 1.7–7.7)
Neutrophils Relative %: 60 %
Platelets: 354 10*3/uL (ref 150–400)
RBC: 5.13 MIL/uL (ref 4.22–5.81)
RDW: 12 % (ref 11.5–15.5)
WBC: 9.3 10*3/uL (ref 4.0–10.5)
nRBC: 0 % (ref 0.0–0.2)

## 2021-03-21 LAB — BASIC METABOLIC PANEL
Anion gap: 12 (ref 5–15)
BUN: 19 mg/dL (ref 6–20)
CO2: 25 mmol/L (ref 22–32)
Calcium: 9.9 mg/dL (ref 8.9–10.3)
Chloride: 102 mmol/L (ref 98–111)
Creatinine, Ser: 1.21 mg/dL (ref 0.61–1.24)
GFR, Estimated: >60 mL/min (ref >60)
Glucose, Bld: 172 mg/dL — ABNORMAL HIGH (ref 70–99)
Potassium: 4.5 mmol/L (ref 3.5–5.1)
Sodium: 139 mmol/L (ref 135–145)

## 2021-03-21 MED ORDER — HYDROMORPHONE HCL 1 MG/ML IJ SOLN
1.0000 mg | Freq: Once | INTRAMUSCULAR | Status: AC
  Administered 2021-03-21: 1 mg via INTRAVENOUS
  Filled 2021-03-21: qty 1

## 2021-03-21 MED ORDER — TAMSULOSIN HCL 0.4 MG PO CAPS: ORAL_CAPSULE | ORAL | 0 refills | Status: AC

## 2021-03-21 MED ORDER — ONDANSETRON HCL 8 MG PO TABS
8.0000 mg | ORAL_TABLET | Freq: Three times a day (TID) | ORAL | 1 refills | Status: AC | PRN
Start: 2021-03-21 — End: ?

## 2021-03-21 MED ORDER — HYDROMORPHONE HCL 2 MG PO TABS: 2.0000 mg | ORAL_TABLET | ORAL | 0 refills | Status: AC | PRN

## 2021-03-21 MED ORDER — TAMSULOSIN HCL 0.4 MG PO CAPS
0.4000 mg | ORAL_CAPSULE | Freq: Once | ORAL | Status: AC
  Administered 2021-03-21: 0.4 mg via ORAL
  Filled 2021-03-21: qty 1

## 2021-03-21 MED ORDER — KETOROLAC TROMETHAMINE 15 MG/ML IJ SOLN
15.0000 mg | Freq: Once | INTRAMUSCULAR | Status: AC
  Administered 2021-03-21: 15 mg via INTRAVENOUS
  Filled 2021-03-21: qty 1

## 2021-03-21 MED ORDER — ONDANSETRON HCL 4 MG/2ML IJ SOLN
4.0000 mg | Freq: Once | INTRAMUSCULAR | Status: AC | PRN
  Administered 2021-03-21: 4 mg via INTRAVENOUS
  Filled 2021-03-21: qty 2

## 2021-03-21 MED ORDER — ONDANSETRON HCL 4 MG/2ML IJ SOLN: 4.0000 mg | Freq: Once | INTRAMUSCULAR | Status: DC

## 2021-03-21 NOTE — ED Provider Notes (Signed)
[facility] Provider Note: Georgena Spurling, MD, FACEP  CSN: [id number] MRN: [id number] ARRIVAL: 03/21/21 at [place]: [place]  Flank Pain   HISTORY OF PRESENT ILLNESS  03/21/21 3:02 AM Matthew Mcintyre is a 56 y.o. male with right flank pain radiating to his right lower quadrant that began about 10:30 PM yesterday evening.  The pain is severe and it is not worse with movement or palpation.  He has had associated nausea and vomiting.  He denies dysuria or hematuria.  He has no history of kidney stones or similar pain in the past.   Past Medical History:  Diagnosis Date   Diabetes mellitus without complication ([place])     Past Surgical History:  Procedure Laterality Date   INGUINAL HERNIA REPAIR      Family History  Problem Relation Age of Onset   Hypertension Mother    Cancer Other    Heart attack Neg Hx    Diabetes Neg Hx     Social History   Tobacco Use   Smoking status: Never   Smokeless tobacco: Never  Substance Use Topics   Alcohol use: Not Currently   Drug use: Never    Prior to Admission medications   Medication Sig Start Date End Date Taking? Authorizing Provider  HYDROmorphone (DILAUDID) 2 MG tablet Take 1 tablet (2 mg total) by mouth every 4 (four) hours as needed for severe pain. 03/21/21  Yes Teondre Jarosz, MD  ondansetron (ZOFRAN) 8 MG tablet Take 1 tablet (8 mg total) by mouth every 8 (eight) hours as needed for nausea or vomiting. 03/21/21  Yes Susan Arana, MD  tamsulosin (FLOMAX) 0.4 MG CAPS capsule Take 1 capsule daily until stone passes. 03/21/21  Yes Bentlie Withem, MD  atorvastatin (LIPITOR) 10 MG tablet Take 10 mg by mouth daily.    [provider]  escitalopram (LEXAPRO) 10 MG tablet Take 10 mg by mouth daily.    [provider]  hydrOXYzine (VISTARIL) 50 MG capsule Take 50 mg by mouth 3 (three) times daily as needed.    [provider]  metFORMIN (GLUCOPHAGE) 500 MG tablet Take by mouth 2 (two)  times daily with a meal.    [provider]  Omega-3 Fatty Acids (FISH OIL) 1000 MG CAPS Take by mouth.    [provider]    Allergies Patient has no known allergies.   REVIEW OF SYSTEMS  Negative except as noted here or in the History of Present Illness.   PHYSICAL EXAMINATION  Initial Vital Signs Blood pressure (!) 140/96, pulse 78, temperature 97.7 F (36.5 C), temperature source Oral, resp. rate 20, height 5\' 10"  (1.778 m), weight 84.8 kg, SpO2 98 %.  Examination General: Well-developed, well-nourished male in no acute distress; appearance consistent with age of record HENT: normocephalic; atraumatic Eyes: normal appearance Neck: supple Heart: regular rate and rhythm Lungs: clear to auscultation bilaterally Abdomen: soft; nondistended; nontender; bowel sounds present GU: No CVA tenderness Extremities: No deformity; full range of motion Neurologic: Awake, alert and oriented; motor function intact in all extremities and symmetric; no facial droop Skin: Warm and dry Psychiatric: Pacing   RESULTS  Summary of this visit's results, reviewed and interpreted by myself:   EKG Interpretation  Date/Time:    Ventricular Rate:    PR Interval:    QRS Duration:   QT Interval:    QTC Calculation:   R Axis:     Text Interpretation:  Laboratory Studies: Results for orders placed or performed during the hospital encounter of 03/21/21 (from the past 24 hour(s))  Urinalysis, Routine w reflex microscopic Urine, Clean Catch     Status: Abnormal   Collection Time: 03/21/21  2:55 AM  Result Value Ref Range   Color, Urine YELLOW YELLOW   APPearance CLEAR CLEAR   Specific Gravity, Urine 1.024 1.005 - 1.030   pH 6.0 5.0 - 8.0   Glucose, UA NEGATIVE NEGATIVE mg/dL   Hgb urine dipstick LARGE (A) NEGATIVE   Bilirubin Urine NEGATIVE NEGATIVE   Ketones, ur NEGATIVE NEGATIVE mg/dL   Protein, ur TRACE (A) NEGATIVE mg/dL   Nitrite NEGATIVE NEGATIVE    Leukocytes,Ua NEGATIVE NEGATIVE   RBC / HPF >50 (H) 0 - 5 RBC/hpf   WBC, UA 0-5 0 - 5 WBC/hpf   Bacteria, UA RARE (A) NONE SEEN   Mucus PRESENT   CBC with Differential/Platelet     Status: None   Collection Time: 03/21/21  2:55 AM  Result Value Ref Range   WBC 9.3 4.0 - 10.5 K/uL   RBC 5.13 4.22 - 5.81 MIL/uL   Hemoglobin 14.7 13.0 - 17.0 g/dL   HCT 42.7 39.0 - 52.0 %   MCV 83.2 80.0 - 100.0 fL   MCH 28.7 26.0 - 34.0 pg   MCHC 34.4 30.0 - 36.0 g/dL   RDW 12.0 11.5 - 15.5 %   Platelets 354 150 - 400 K/uL   nRBC 0.0 0.0 - 0.2 %   Neutrophils Relative % 60 %   Neutro Abs 5.6 1.7 - 7.7 K/uL   Lymphocytes Relative 32 %   Lymphs Abs 2.9 0.7 - 4.0 K/uL   Monocytes Relative 5 %   Monocytes Absolute 0.4 0.1 - 1.0 K/uL   Eosinophils Relative 2 %   Eosinophils Absolute 0.2 0.0 - 0.5 K/uL   Basophils Relative 1 %   Basophils Absolute 0.1 0.0 - 0.1 K/uL   Immature Granulocytes 0 %   Abs Immature Granulocytes 0.03 0.00 - 0.07 K/uL  Basic metabolic panel     Status: Abnormal   Collection Time: 03/21/21  2:55 AM  Result Value Ref Range   Sodium 139 135 - 145 mmol/L   Potassium 4.5 3.5 - 5.1 mmol/L   Chloride 102 98 - 111 mmol/L   CO2 25 22 - 32 mmol/L   Glucose, Bld 172 (H) 70 - 99 mg/dL   BUN 19 6 - 20 mg/dL   Creatinine, Ser 1.21 0.61 - 1.24 mg/dL   Calcium 9.9 8.9 - 10.3 mg/dL   GFR, Estimated >60 >60 mL/min   Anion gap 12 5 - 15   Imaging Studies: CT ABDOMEN PELVIS WO CONTRAST  Result Date: 03/21/2021 CLINICAL DATA:  Right flank pain EXAM: CT ABDOMEN AND PELVIS WITHOUT CONTRAST TECHNIQUE: Multidetector CT imaging of the abdomen and pelvis was performed following the standard protocol without IV contrast. RADIATION DOSE REDUCTION: This exam was performed according to the departmental dose-optimization program which includes automated exposure control, adjustment of the mA and/or kV according to patient size and/or use of iterative reconstruction technique. COMPARISON:  None.  FINDINGS: Lower chest: Two 3 mm triangular subpleural nodules in the left lower lobe (series 4/image 11 and 12), compatible with benign subpleural lymph nodes. Hepatobiliary: Hepatic steatosis with focal fatty sparing. Gallbladder is unremarkable. No intrahepatic or extrahepatic ductal dilatation. Pancreas: Within normal limits. Spleen: Within normal limits. Adrenals/Urinary Tract: Adrenal glands are within normal limits. 3 mm nonobstructing right upper pole renal  calculus (series 2/image 30). Mild right hydroureteronephrosis with associated 4 mm proximal right ureteral calculus (coronal image 55). Left kidney is within normal limits.  No hydronephrosis. Bladder is within normal limits. Stomach/Bowel: Stomach is within normal limits. No evidence of bowel obstruction. Wall thickening involving the duodenum and proximal jejunum, with submucosal fat deposition suggesting chronic inflammation, but with mild mesenteric stranding along the left adrenal mesentery (series 2/image 35). This appearance at least raises the possibility of small bowel enteritis, although this is not favored. Normal appendix (series 2/image 84). No colonic wall thickening or inflammatory changes. Vascular/Lymphatic: No evidence of abdominal aortic aneurysm. Atherosclerotic calcifications of the abdominal aorta and branch vessels. No suspicious abdominopelvic lymphadenopathy. Reproductive: Prostate is unremarkable. Other: No abdominopelvic ascites. Postsurgical changes related to prior right lower ventral/inguinal hernia repair. Musculoskeletal: Visualized osseous structures are within normal limits. IMPRESSION: 4 mm proximal right ureteral calculus with mild right hydroureteronephrosis. Additional 3 mm nonobstructing right upper pole renal calculus. Possible mild small bowel enteritis, equivocal. Hepatic steatosis with focal fatty sparing. Two 3 mm triangular subpleural nodules in the left lower lobe, favoring benign subpleural lymph nodes. No  follow-up needed if patient is low-risk. Non-contrast chest CT can be considered in 12 months if patient is high-risk. This recommendation follows the consensus statement: Guidelines for Management of Incidental Pulmonary Nodules Detected on CT Images: From the Fleischner Society 2017; Radiology 2017; 284:228-243. Electronically Signed   By: Julian Hy M.D.   On: 03/21/2021 03:45    ED COURSE and MDM  Nursing notes, initial and subsequent vitals signs, including pulse oximetry, reviewed and interpreted by myself.  Vitals:   03/21/21 0315 03/21/21 0354 03/21/21 0400 03/21/21 0430  BP: 129/61 136/81 126/73 115/71  Pulse: 77 68 82 83  Resp: 18 17  17   Temp:      TempSrc:      SpO2: 99% 94% 92% 94%  Weight:      Height:       Medications  ondansetron (ZOFRAN) injection 4 mg (4 mg Intravenous Given 03/21/21 0309)  HYDROmorphone (DILAUDID) injection 1 mg (1 mg Intravenous Given 03/21/21 0311)  HYDROmorphone (DILAUDID) injection 1 mg (1 mg Intravenous Given 03/21/21 0420)  ketorolac (TORADOL) 15 MG/ML injection 15 mg (15 mg Intravenous Given 03/21/21 0417)  tamsulosin (FLOMAX) capsule 0.4 mg (0.4 mg Oral Given 03/21/21 0417)   4:32 AM Patient has negligible pain at this time.  His presentation is consistent with ureteral colic and this is confirmed by CT scan.  His stone is 4 mm and he has about an 80% chance of passing this without surgical intervention.  Nevertheless we will refer to urology if he does not pass the stone on his own.   PROCEDURES  Procedures   ED DIAGNOSES     ICD-10-CM   1. Ureterolithiasis  N20.1          Kenneisha Cochrane, MD 03/21/21 [phone]

## 2021-03-21 NOTE — ED Triage Notes (Signed)
Abdominal with nausea and vomiting started around 2200 last night.

## 2021-03-21 NOTE — ED Triage Notes (Signed)
Patient added during triage that his pain was in his right flank radiating to his right abdomen

## 2021-04-10 ENCOUNTER — Other Ambulatory Visit (HOSPITAL_BASED_OUTPATIENT_CLINIC_OR_DEPARTMENT_OTHER): Payer: Self-pay

## 2021-04-10 DIAGNOSIS — N201 Calculus of ureter: Secondary | ICD-10-CM | POA: Diagnosis not present

## 2021-04-10 DIAGNOSIS — N132 Hydronephrosis with renal and ureteral calculous obstruction: Secondary | ICD-10-CM | POA: Diagnosis not present

## 2021-04-10 MED ORDER — TAMSULOSIN HCL 0.4 MG PO CAPS
ORAL_CAPSULE | ORAL | 1 refills | Status: AC
  Filled 2021-04-10: qty 15, 15d supply, fill #0

## 2021-04-13 DIAGNOSIS — M9901 Segmental and somatic dysfunction of cervical region: Secondary | ICD-10-CM | POA: Diagnosis not present

## 2021-04-13 DIAGNOSIS — M62830 Muscle spasm of back: Secondary | ICD-10-CM | POA: Diagnosis not present

## 2021-04-13 DIAGNOSIS — M47812 Spondylosis without myelopathy or radiculopathy, cervical region: Secondary | ICD-10-CM | POA: Diagnosis not present

## 2021-04-13 DIAGNOSIS — M9902 Segmental and somatic dysfunction of thoracic region: Secondary | ICD-10-CM | POA: Diagnosis not present

## 2021-04-24 ENCOUNTER — Other Ambulatory Visit (HOSPITAL_BASED_OUTPATIENT_CLINIC_OR_DEPARTMENT_OTHER): Payer: Self-pay

## 2021-04-24 DIAGNOSIS — N132 Hydronephrosis with renal and ureteral calculous obstruction: Secondary | ICD-10-CM | POA: Diagnosis not present

## 2021-04-24 DIAGNOSIS — N201 Calculus of ureter: Secondary | ICD-10-CM | POA: Diagnosis not present

## 2021-04-24 MED ORDER — TAMSULOSIN HCL 0.4 MG PO CAPS
ORAL_CAPSULE | ORAL | 1 refills | Status: AC
  Filled 2021-04-24: qty 30, 30d supply, fill #0
  Filled 2021-06-03: qty 30, 30d supply, fill #1

## 2021-05-08 DIAGNOSIS — N132 Hydronephrosis with renal and ureteral calculous obstruction: Secondary | ICD-10-CM | POA: Diagnosis not present

## 2021-05-25 DIAGNOSIS — M9901 Segmental and somatic dysfunction of cervical region: Secondary | ICD-10-CM | POA: Diagnosis not present

## 2021-05-25 DIAGNOSIS — M47812 Spondylosis without myelopathy or radiculopathy, cervical region: Secondary | ICD-10-CM | POA: Diagnosis not present

## 2021-05-25 DIAGNOSIS — M62830 Muscle spasm of back: Secondary | ICD-10-CM | POA: Diagnosis not present

## 2021-05-25 DIAGNOSIS — M9902 Segmental and somatic dysfunction of thoracic region: Secondary | ICD-10-CM | POA: Diagnosis not present

## 2021-06-04 ENCOUNTER — Other Ambulatory Visit (HOSPITAL_BASED_OUTPATIENT_CLINIC_OR_DEPARTMENT_OTHER): Payer: Self-pay

## 2021-06-05 DIAGNOSIS — N201 Calculus of ureter: Secondary | ICD-10-CM | POA: Diagnosis not present

## 2021-07-06 DIAGNOSIS — M9902 Segmental and somatic dysfunction of thoracic region: Secondary | ICD-10-CM | POA: Diagnosis not present

## 2021-07-06 DIAGNOSIS — M62830 Muscle spasm of back: Secondary | ICD-10-CM | POA: Diagnosis not present

## 2021-07-06 DIAGNOSIS — M47812 Spondylosis without myelopathy or radiculopathy, cervical region: Secondary | ICD-10-CM | POA: Diagnosis not present

## 2021-07-06 DIAGNOSIS — M9901 Segmental and somatic dysfunction of cervical region: Secondary | ICD-10-CM | POA: Diagnosis not present

## 2021-07-26 DIAGNOSIS — Z125 Encounter for screening for malignant neoplasm of prostate: Secondary | ICD-10-CM | POA: Diagnosis not present

## 2021-07-26 DIAGNOSIS — E1169 Type 2 diabetes mellitus with other specified complication: Secondary | ICD-10-CM | POA: Diagnosis not present

## 2021-07-26 DIAGNOSIS — F3341 Major depressive disorder, recurrent, in partial remission: Secondary | ICD-10-CM | POA: Diagnosis not present

## 2021-07-26 DIAGNOSIS — E782 Mixed hyperlipidemia: Secondary | ICD-10-CM | POA: Diagnosis not present

## 2021-07-26 DIAGNOSIS — Z0000 Encounter for general adult medical examination without abnormal findings: Secondary | ICD-10-CM | POA: Diagnosis not present

## 2021-07-26 DIAGNOSIS — F411 Generalized anxiety disorder: Secondary | ICD-10-CM | POA: Diagnosis not present

## 2021-08-02 DIAGNOSIS — E119 Type 2 diabetes mellitus without complications: Secondary | ICD-10-CM | POA: Diagnosis not present

## 2021-08-17 DIAGNOSIS — M9901 Segmental and somatic dysfunction of cervical region: Secondary | ICD-10-CM | POA: Diagnosis not present

## 2021-08-17 DIAGNOSIS — M62830 Muscle spasm of back: Secondary | ICD-10-CM | POA: Diagnosis not present

## 2021-08-17 DIAGNOSIS — M47812 Spondylosis without myelopathy or radiculopathy, cervical region: Secondary | ICD-10-CM | POA: Diagnosis not present

## 2021-08-17 DIAGNOSIS — M9902 Segmental and somatic dysfunction of thoracic region: Secondary | ICD-10-CM | POA: Diagnosis not present

## 2021-10-19 DIAGNOSIS — M9901 Segmental and somatic dysfunction of cervical region: Secondary | ICD-10-CM | POA: Diagnosis not present

## 2021-10-19 DIAGNOSIS — M9902 Segmental and somatic dysfunction of thoracic region: Secondary | ICD-10-CM | POA: Diagnosis not present

## 2021-10-19 DIAGNOSIS — M47812 Spondylosis without myelopathy or radiculopathy, cervical region: Secondary | ICD-10-CM | POA: Diagnosis not present

## 2021-10-19 DIAGNOSIS — M62830 Muscle spasm of back: Secondary | ICD-10-CM | POA: Diagnosis not present

## 2021-12-05 DIAGNOSIS — N200 Calculus of kidney: Secondary | ICD-10-CM | POA: Diagnosis not present

## 2021-12-21 DIAGNOSIS — M9902 Segmental and somatic dysfunction of thoracic region: Secondary | ICD-10-CM | POA: Diagnosis not present

## 2021-12-21 DIAGNOSIS — M62830 Muscle spasm of back: Secondary | ICD-10-CM | POA: Diagnosis not present

## 2021-12-21 DIAGNOSIS — M47812 Spondylosis without myelopathy or radiculopathy, cervical region: Secondary | ICD-10-CM | POA: Diagnosis not present

## 2021-12-21 DIAGNOSIS — M9901 Segmental and somatic dysfunction of cervical region: Secondary | ICD-10-CM | POA: Diagnosis not present

## 2022-01-29 DIAGNOSIS — M9902 Segmental and somatic dysfunction of thoracic region: Secondary | ICD-10-CM | POA: Diagnosis not present

## 2022-01-29 DIAGNOSIS — M62830 Muscle spasm of back: Secondary | ICD-10-CM | POA: Diagnosis not present

## 2022-01-29 DIAGNOSIS — M47812 Spondylosis without myelopathy or radiculopathy, cervical region: Secondary | ICD-10-CM | POA: Diagnosis not present

## 2022-01-29 DIAGNOSIS — M9901 Segmental and somatic dysfunction of cervical region: Secondary | ICD-10-CM | POA: Diagnosis not present

## 2022-01-30 DIAGNOSIS — F411 Generalized anxiety disorder: Secondary | ICD-10-CM | POA: Diagnosis not present

## 2022-01-30 DIAGNOSIS — E1169 Type 2 diabetes mellitus with other specified complication: Secondary | ICD-10-CM | POA: Diagnosis not present

## 2022-01-30 DIAGNOSIS — Z6827 Body mass index (BMI) 27.0-27.9, adult: Secondary | ICD-10-CM | POA: Diagnosis not present

## 2022-01-30 DIAGNOSIS — F3341 Major depressive disorder, recurrent, in partial remission: Secondary | ICD-10-CM | POA: Diagnosis not present

## 2022-01-31 DIAGNOSIS — E1169 Type 2 diabetes mellitus with other specified complication: Secondary | ICD-10-CM | POA: Diagnosis not present

## 2022-01-31 DIAGNOSIS — E785 Hyperlipidemia, unspecified: Secondary | ICD-10-CM | POA: Diagnosis not present

## 2022-01-31 DIAGNOSIS — M546 Pain in thoracic spine: Secondary | ICD-10-CM | POA: Diagnosis not present

## 2022-01-31 DIAGNOSIS — Z6827 Body mass index (BMI) 27.0-27.9, adult: Secondary | ICD-10-CM | POA: Diagnosis not present

## 2022-03-22 DIAGNOSIS — M9902 Segmental and somatic dysfunction of thoracic region: Secondary | ICD-10-CM | POA: Diagnosis not present

## 2022-03-22 DIAGNOSIS — M62830 Muscle spasm of back: Secondary | ICD-10-CM | POA: Diagnosis not present

## 2022-03-22 DIAGNOSIS — M47812 Spondylosis without myelopathy or radiculopathy, cervical region: Secondary | ICD-10-CM | POA: Diagnosis not present

## 2022-03-22 DIAGNOSIS — M9901 Segmental and somatic dysfunction of cervical region: Secondary | ICD-10-CM | POA: Diagnosis not present

## 2022-05-03 DIAGNOSIS — M9901 Segmental and somatic dysfunction of cervical region: Secondary | ICD-10-CM | POA: Diagnosis not present

## 2022-05-03 DIAGNOSIS — M62830 Muscle spasm of back: Secondary | ICD-10-CM | POA: Diagnosis not present

## 2022-05-03 DIAGNOSIS — M47812 Spondylosis without myelopathy or radiculopathy, cervical region: Secondary | ICD-10-CM | POA: Diagnosis not present

## 2022-05-03 DIAGNOSIS — M9902 Segmental and somatic dysfunction of thoracic region: Secondary | ICD-10-CM | POA: Diagnosis not present

## 2022-07-05 DIAGNOSIS — M9901 Segmental and somatic dysfunction of cervical region: Secondary | ICD-10-CM | POA: Diagnosis not present

## 2022-07-05 DIAGNOSIS — M9902 Segmental and somatic dysfunction of thoracic region: Secondary | ICD-10-CM | POA: Diagnosis not present

## 2022-07-05 DIAGNOSIS — M62830 Muscle spasm of back: Secondary | ICD-10-CM | POA: Diagnosis not present

## 2022-07-05 DIAGNOSIS — M47812 Spondylosis without myelopathy or radiculopathy, cervical region: Secondary | ICD-10-CM | POA: Diagnosis not present

## 2022-08-02 DIAGNOSIS — F3341 Major depressive disorder, recurrent, in partial remission: Secondary | ICD-10-CM | POA: Diagnosis not present

## 2022-08-02 DIAGNOSIS — E782 Mixed hyperlipidemia: Secondary | ICD-10-CM | POA: Diagnosis not present

## 2022-08-02 DIAGNOSIS — E1169 Type 2 diabetes mellitus with other specified complication: Secondary | ICD-10-CM | POA: Diagnosis not present

## 2022-08-02 DIAGNOSIS — Z125 Encounter for screening for malignant neoplasm of prostate: Secondary | ICD-10-CM | POA: Diagnosis not present

## 2022-08-02 DIAGNOSIS — Z0000 Encounter for general adult medical examination without abnormal findings: Secondary | ICD-10-CM | POA: Diagnosis not present

## 2022-08-02 DIAGNOSIS — F411 Generalized anxiety disorder: Secondary | ICD-10-CM | POA: Diagnosis not present

## 2022-08-21 DIAGNOSIS — M546 Pain in thoracic spine: Secondary | ICD-10-CM | POA: Diagnosis not present

## 2022-08-27 DIAGNOSIS — H524 Presbyopia: Secondary | ICD-10-CM | POA: Diagnosis not present

## 2022-08-27 DIAGNOSIS — E119 Type 2 diabetes mellitus without complications: Secondary | ICD-10-CM | POA: Diagnosis not present

## 2022-08-30 DIAGNOSIS — M47812 Spondylosis without myelopathy or radiculopathy, cervical region: Secondary | ICD-10-CM | POA: Diagnosis not present

## 2022-08-30 DIAGNOSIS — M62830 Muscle spasm of back: Secondary | ICD-10-CM | POA: Diagnosis not present

## 2022-08-30 DIAGNOSIS — M9901 Segmental and somatic dysfunction of cervical region: Secondary | ICD-10-CM | POA: Diagnosis not present

## 2022-08-30 DIAGNOSIS — M9902 Segmental and somatic dysfunction of thoracic region: Secondary | ICD-10-CM | POA: Diagnosis not present

## 2022-09-04 DIAGNOSIS — M546 Pain in thoracic spine: Secondary | ICD-10-CM | POA: Diagnosis not present

## 2022-09-11 DIAGNOSIS — M546 Pain in thoracic spine: Secondary | ICD-10-CM | POA: Diagnosis not present

## 2022-09-17 DIAGNOSIS — M546 Pain in thoracic spine: Secondary | ICD-10-CM | POA: Diagnosis not present

## 2022-10-08 DIAGNOSIS — M546 Pain in thoracic spine: Secondary | ICD-10-CM | POA: Diagnosis not present

## 2022-10-15 DIAGNOSIS — M546 Pain in thoracic spine: Secondary | ICD-10-CM | POA: Diagnosis not present

## 2022-10-25 DIAGNOSIS — M47812 Spondylosis without myelopathy or radiculopathy, cervical region: Secondary | ICD-10-CM | POA: Diagnosis not present

## 2022-10-25 DIAGNOSIS — M62830 Muscle spasm of back: Secondary | ICD-10-CM | POA: Diagnosis not present

## 2022-10-25 DIAGNOSIS — M9902 Segmental and somatic dysfunction of thoracic region: Secondary | ICD-10-CM | POA: Diagnosis not present

## 2022-10-25 DIAGNOSIS — M9901 Segmental and somatic dysfunction of cervical region: Secondary | ICD-10-CM | POA: Diagnosis not present

## 2023-01-08 DIAGNOSIS — M9902 Segmental and somatic dysfunction of thoracic region: Secondary | ICD-10-CM | POA: Diagnosis not present

## 2023-01-08 DIAGNOSIS — M9901 Segmental and somatic dysfunction of cervical region: Secondary | ICD-10-CM | POA: Diagnosis not present

## 2023-01-08 DIAGNOSIS — M62830 Muscle spasm of back: Secondary | ICD-10-CM | POA: Diagnosis not present

## 2023-01-08 DIAGNOSIS — M47812 Spondylosis without myelopathy or radiculopathy, cervical region: Secondary | ICD-10-CM | POA: Diagnosis not present

## 2023-02-06 DIAGNOSIS — E1169 Type 2 diabetes mellitus with other specified complication: Secondary | ICD-10-CM | POA: Diagnosis not present

## 2023-02-06 DIAGNOSIS — E782 Mixed hyperlipidemia: Secondary | ICD-10-CM | POA: Diagnosis not present

## 2023-02-06 DIAGNOSIS — F411 Generalized anxiety disorder: Secondary | ICD-10-CM | POA: Diagnosis not present

## 2023-02-06 DIAGNOSIS — F3341 Major depressive disorder, recurrent, in partial remission: Secondary | ICD-10-CM | POA: Diagnosis not present

## 2023-02-26 DIAGNOSIS — M47812 Spondylosis without myelopathy or radiculopathy, cervical region: Secondary | ICD-10-CM | POA: Diagnosis not present

## 2023-02-26 DIAGNOSIS — M9901 Segmental and somatic dysfunction of cervical region: Secondary | ICD-10-CM | POA: Diagnosis not present

## 2023-02-26 DIAGNOSIS — M62830 Muscle spasm of back: Secondary | ICD-10-CM | POA: Diagnosis not present

## 2023-02-26 DIAGNOSIS — M9902 Segmental and somatic dysfunction of thoracic region: Secondary | ICD-10-CM | POA: Diagnosis not present

## 2023-04-16 DIAGNOSIS — M62830 Muscle spasm of back: Secondary | ICD-10-CM | POA: Diagnosis not present

## 2023-04-16 DIAGNOSIS — M9902 Segmental and somatic dysfunction of thoracic region: Secondary | ICD-10-CM | POA: Diagnosis not present

## 2023-04-16 DIAGNOSIS — M47812 Spondylosis without myelopathy or radiculopathy, cervical region: Secondary | ICD-10-CM | POA: Diagnosis not present

## 2023-04-16 DIAGNOSIS — M9901 Segmental and somatic dysfunction of cervical region: Secondary | ICD-10-CM | POA: Diagnosis not present

## 2023-05-13 DIAGNOSIS — M7711 Lateral epicondylitis, right elbow: Secondary | ICD-10-CM | POA: Diagnosis not present

## 2023-06-05 DIAGNOSIS — M62830 Muscle spasm of back: Secondary | ICD-10-CM | POA: Diagnosis not present

## 2023-06-05 DIAGNOSIS — M47812 Spondylosis without myelopathy or radiculopathy, cervical region: Secondary | ICD-10-CM | POA: Diagnosis not present

## 2023-06-05 DIAGNOSIS — M9902 Segmental and somatic dysfunction of thoracic region: Secondary | ICD-10-CM | POA: Diagnosis not present

## 2023-06-05 DIAGNOSIS — M9901 Segmental and somatic dysfunction of cervical region: Secondary | ICD-10-CM | POA: Diagnosis not present

## 2023-08-07 DIAGNOSIS — M62830 Muscle spasm of back: Secondary | ICD-10-CM | POA: Diagnosis not present

## 2023-08-07 DIAGNOSIS — M9901 Segmental and somatic dysfunction of cervical region: Secondary | ICD-10-CM | POA: Diagnosis not present

## 2023-08-07 DIAGNOSIS — M9902 Segmental and somatic dysfunction of thoracic region: Secondary | ICD-10-CM | POA: Diagnosis not present

## 2023-08-07 DIAGNOSIS — M47812 Spondylosis without myelopathy or radiculopathy, cervical region: Secondary | ICD-10-CM | POA: Diagnosis not present

## 2023-08-25 DIAGNOSIS — Z0000 Encounter for general adult medical examination without abnormal findings: Secondary | ICD-10-CM | POA: Diagnosis not present

## 2023-08-25 DIAGNOSIS — Z6826 Body mass index (BMI) 26.0-26.9, adult: Secondary | ICD-10-CM | POA: Diagnosis not present

## 2023-08-25 DIAGNOSIS — E782 Mixed hyperlipidemia: Secondary | ICD-10-CM | POA: Diagnosis not present

## 2023-08-25 DIAGNOSIS — F3341 Major depressive disorder, recurrent, in partial remission: Secondary | ICD-10-CM | POA: Diagnosis not present

## 2023-08-25 DIAGNOSIS — E1169 Type 2 diabetes mellitus with other specified complication: Secondary | ICD-10-CM | POA: Diagnosis not present

## 2023-08-25 DIAGNOSIS — F411 Generalized anxiety disorder: Secondary | ICD-10-CM | POA: Diagnosis not present

## 2023-08-25 DIAGNOSIS — Z125 Encounter for screening for malignant neoplasm of prostate: Secondary | ICD-10-CM | POA: Diagnosis not present

## 2023-09-18 DIAGNOSIS — M47812 Spondylosis without myelopathy or radiculopathy, cervical region: Secondary | ICD-10-CM | POA: Diagnosis not present

## 2023-09-18 DIAGNOSIS — M9901 Segmental and somatic dysfunction of cervical region: Secondary | ICD-10-CM | POA: Diagnosis not present

## 2023-09-18 DIAGNOSIS — M9902 Segmental and somatic dysfunction of thoracic region: Secondary | ICD-10-CM | POA: Diagnosis not present

## 2023-09-18 DIAGNOSIS — M62830 Muscle spasm of back: Secondary | ICD-10-CM | POA: Diagnosis not present

## 2023-09-29 DIAGNOSIS — E119 Type 2 diabetes mellitus without complications: Secondary | ICD-10-CM | POA: Diagnosis not present

## 2023-11-12 DIAGNOSIS — M47812 Spondylosis without myelopathy or radiculopathy, cervical region: Secondary | ICD-10-CM | POA: Diagnosis not present

## 2023-11-12 DIAGNOSIS — M9902 Segmental and somatic dysfunction of thoracic region: Secondary | ICD-10-CM | POA: Diagnosis not present

## 2023-11-12 DIAGNOSIS — M9901 Segmental and somatic dysfunction of cervical region: Secondary | ICD-10-CM | POA: Diagnosis not present

## 2023-11-12 DIAGNOSIS — M62830 Muscle spasm of back: Secondary | ICD-10-CM | POA: Diagnosis not present

## 2023-12-24 DIAGNOSIS — M9901 Segmental and somatic dysfunction of cervical region: Secondary | ICD-10-CM | POA: Diagnosis not present

## 2023-12-24 DIAGNOSIS — M47812 Spondylosis without myelopathy or radiculopathy, cervical region: Secondary | ICD-10-CM | POA: Diagnosis not present

## 2023-12-24 DIAGNOSIS — M9902 Segmental and somatic dysfunction of thoracic region: Secondary | ICD-10-CM | POA: Diagnosis not present

## 2023-12-24 DIAGNOSIS — M62830 Muscle spasm of back: Secondary | ICD-10-CM | POA: Diagnosis not present

## 2024-02-04 DIAGNOSIS — M9902 Segmental and somatic dysfunction of thoracic region: Secondary | ICD-10-CM | POA: Diagnosis not present

## 2024-02-04 DIAGNOSIS — M9901 Segmental and somatic dysfunction of cervical region: Secondary | ICD-10-CM | POA: Diagnosis not present

## 2024-02-04 DIAGNOSIS — M62830 Muscle spasm of back: Secondary | ICD-10-CM | POA: Diagnosis not present

## 2024-02-04 DIAGNOSIS — M47812 Spondylosis without myelopathy or radiculopathy, cervical region: Secondary | ICD-10-CM | POA: Diagnosis not present

## 2024-02-06 DIAGNOSIS — F3341 Major depressive disorder, recurrent, in partial remission: Secondary | ICD-10-CM | POA: Diagnosis not present

## 2024-02-06 DIAGNOSIS — E1169 Type 2 diabetes mellitus with other specified complication: Secondary | ICD-10-CM | POA: Diagnosis not present

## 2024-02-06 DIAGNOSIS — E782 Mixed hyperlipidemia: Secondary | ICD-10-CM | POA: Diagnosis not present
